# Patient Record
Sex: Female | Born: 1970 | Race: White | Hispanic: No | Marital: Married | Smoking: Never smoker
Health system: Southern US, Community
[De-identification: ages and names within clinical notes are randomized; demographics above are authoritative.]

## PROBLEM LIST (undated history)

## (undated) DIAGNOSIS — D649 Anemia, unspecified: Secondary | ICD-10-CM

## (undated) DIAGNOSIS — C801 Malignant (primary) neoplasm, unspecified: Secondary | ICD-10-CM

## (undated) HISTORY — DX: Malignant (primary) neoplasm, unspecified: C80.1

## (undated) HISTORY — DX: Anemia, unspecified: D64.9

---

## 2004-03-01 HISTORY — PX: SKIN CANCER EXCISION: SHX779

## 2005-12-17 ENCOUNTER — Ambulatory Visit: Payer: Self-pay | Admitting: Gynecology

## 2006-01-31 ENCOUNTER — Encounter (INDEPENDENT_AMBULATORY_CARE_PROVIDER_SITE_OTHER): Payer: Self-pay | Admitting: Gynecology

## 2006-01-31 ENCOUNTER — Ambulatory Visit: Payer: Self-pay | Admitting: Gynecology

## 2007-03-06 ENCOUNTER — Encounter (INDEPENDENT_AMBULATORY_CARE_PROVIDER_SITE_OTHER): Payer: Self-pay | Admitting: Gynecology

## 2007-03-06 ENCOUNTER — Ambulatory Visit: Payer: Self-pay | Admitting: Gynecology

## 2008-04-11 ENCOUNTER — Encounter: Admission: RE | Admit: 2008-04-11 | Discharge: 2008-04-11 | Payer: Self-pay | Admitting: Internal Medicine

## 2008-04-23 ENCOUNTER — Encounter: Payer: Self-pay | Admitting: Obstetrics & Gynecology

## 2008-04-23 ENCOUNTER — Ambulatory Visit: Payer: Self-pay | Admitting: Obstetrics & Gynecology

## 2008-04-24 ENCOUNTER — Ambulatory Visit: Payer: Self-pay | Admitting: Family Medicine

## 2008-04-24 LAB — CONVERTED CEMR LAB
Alkaline Phosphatase: 63 units/L (ref 39–117)
Cholesterol: 198 mg/dL (ref 0–200)
Creatinine, Ser: 0.68 mg/dL (ref 0.40–1.20)
Glucose, Bld: 101 mg/dL — ABNORMAL HIGH (ref 70–99)
HCT: 36.2 % (ref 36.0–46.0)
LDL Cholesterol: 111 mg/dL — ABNORMAL HIGH (ref 0–99)
MCHC: 32.9 g/dL (ref 30.0–36.0)
MCV: 84.2 fL (ref 78.0–100.0)
Platelets: 162 10*3/uL (ref 150–400)
Sodium: 141 meq/L (ref 135–145)
Total Bilirubin: 0.4 mg/dL (ref 0.3–1.2)
Total CHOL/HDL Ratio: 2.9
Total Protein: 7.1 g/dL (ref 6.0–8.3)
Triglycerides: 96 mg/dL (ref <150)
VLDL: 19 mg/dL (ref 0–40)

## 2009-05-27 ENCOUNTER — Ambulatory Visit: Payer: Self-pay | Admitting: Obstetrics & Gynecology

## 2009-05-27 ENCOUNTER — Encounter: Payer: Self-pay | Admitting: Obstetrics and Gynecology

## 2009-05-27 LAB — CONVERTED CEMR LAB
ALT: 10 units/L (ref 0–35)
AST: 18 units/L (ref 0–37)
Albumin: 4.1 g/dL (ref 3.5–5.2)
Alkaline Phosphatase: 63 units/L (ref 39–117)
Glucose, Bld: 91 mg/dL (ref 70–99)
LDL Cholesterol: 126 mg/dL — ABNORMAL HIGH (ref 0–99)
MCHC: 32.4 g/dL (ref 30.0–36.0)
MCV: 85.8 fL (ref 78.0–100.0)
Platelets: 178 10*3/uL (ref 150–400)
Potassium: 4.4 meq/L (ref 3.5–5.3)
RBC: 4.43 M/uL (ref 3.87–5.11)
RDW: 13.7 % (ref 11.5–15.5)
Sodium: 138 meq/L (ref 135–145)
Total Bilirubin: 0.3 mg/dL (ref 0.3–1.2)
Total Protein: 6.9 g/dL (ref 6.0–8.3)
Triglycerides: 110 mg/dL (ref <150)
VLDL: 22 mg/dL (ref 0–40)

## 2010-06-15 ENCOUNTER — Ambulatory Visit (INDEPENDENT_AMBULATORY_CARE_PROVIDER_SITE_OTHER): Payer: BLUE CROSS/BLUE SHIELD | Admitting: Obstetrics & Gynecology

## 2010-06-15 DIAGNOSIS — Z1272 Encounter for screening for malignant neoplasm of vagina: Secondary | ICD-10-CM

## 2010-06-15 DIAGNOSIS — Z01419 Encounter for gynecological examination (general) (routine) without abnormal findings: Secondary | ICD-10-CM

## 2010-06-16 NOTE — Assessment & Plan Note (Signed)
Julia Dean, GANGI NO.:  [id number]  MEDICAL RECORD NO.:  [id number]           PATIENT TYPE:  LOCATION:  [facility] at [facility]           FACILITY:  PHYSICIAN:  Allie Bossier, MD             DATE OF BIRTH:  DATE OF SERVICE:  06/15/2010                                 CLINIC NOTE  Ms. Julia Dean is a 40 year old married, white G2 P2.  She has a 33 year old son and a 65-year-old daughter.  She comes here for annual exam.  She would like to continue her Mircette, which she takes for menstrual regulation (her husband had a vasectomy).  She has no medical problems.  PAST SURGICAL HISTORY:  She had C-section.  REVIEW OF SYSTEMS:  She is a Chartered loss adjuster.  She has been married for 19 years and denies dyspareunia.  The remainder of review of systems questions are negative.  MEDICATIONS:  Mircette daily and Zyrtec on a p.r.n. basis.  FAMILY HISTORY:  Negative for breast and GYN cancers, but her paternal grandmother had colon cancer.  No known drug allergies.  No latex allergies.  SOCIAL HISTORY:  She denies tobacco or drug use and drinks alcohol on a social basis.  PHYSICAL EXAMINATION:  GENERAL:  Well-nourished, well-hydrated, pleasant white female. VITAL SIGNS:  Height 5-5, weight 162 pounds, blood pressure 128/73, pulse 79. HEENT:  Normal breast exam, normal bilaterally. HEART:  Regular rate and rhythm. LUNGS:  Clear to auscultation bilaterally. ABDOMEN:  Benign. GENITALIA:  External genitalia:  No lesions.  Cervix parous.  Normal discharge.  Uterus normal size and shape, anteverted, mobile.  Adnexa nontender.  No masses.  ASSESSMENT AND PLAN: 1. Annual exam.  I have checked Pap smear.  Recommend self-breast and     self-vulvar exams.  We will schedule her annual mammogram. 2. Menstrual regulation.  I have refilled her Mircette at her request.     Allie Bossier, MD   MCD/MEDQ  D:  06/15/2010  T:  06/16/2010  Job:  [id number]

## 2010-07-14 NOTE — Assessment & Plan Note (Signed)
Julia Dean, Julia Dean NO.:  [id number]   MEDICAL RECORD NO.:  [id number]          PATIENT TYPE:  POB   LOCATION:  [facility] at [facility]         FACILITY:  [facility]   PHYSICIAN:  Argentina Donovan, MD        DATE OF BIRTH:  04-May-1970   DATE OF SERVICE:  05/27/2009                                  CLINIC NOTE   The patient comes to office today for her yearly Pap, pelvic, breast  exam.  The patient is doing very well overall.  She is continuing to  lose weight.  She is down 11 pounds since last year.  She is losing  weight slowly.  The last year, her blood sugar was elevated fasting 101.  She does have a strong family history of diabetes, so we will recheck  that today.  She is also complaining of frequent tension headaches.  She  has been more stressed at work and she also has 2 children, ages of 71  and 51, which keep her very busy.  Her husband works out of town at  times.  She would like to remain on Mircette for regulation of her  menstrual cycle.  Her husband did have a vasectomy this summer and they  no longer need of this for birth control, but she likes the regulation  of her menstrual cycle.  She is not having any health issues this year.   ALLERGIES:  No known drug allergies.   First day of last menstrual period, May 24, 2009.   PHYSICAL EXAMINATION:  VITAL SIGNS:  Blood pressure is 119/79, pulse is  70, weight 162, height is 5 feet 5 inches.  GENERAL:  Well-developed, well-nourished 40 year old Caucasian female,  in no acute distress.  HEENT:  Head is normocephalic and atraumatic.  Pupils equal and react.  Thyroid is negative for enlargement or nodules.  CARDIAC:  Regular rate and rhythm.  LUNGS:  Clear bilaterally.  BREASTS:  Breasts are symmetrical.  There is no skin dimpling.  There is  no nipple discharge.  There are no masses appreciated.  ABDOMEN:  Soft, nontender.  No organomegaly.  PELVIC:  Externally, there are no lesions or discharges.   Internally,  there is a small amount of white nonodorous discharge.  Cervix is not  friable.  Pap smear was taken.  Bimanual exam, no cervical motion  tenderness.  No adnexal mass.  EXTREMITIES:  Warm and dry.   ASSESSMENT:  1. Yearly exam.  The patient will have her CBC, CMET, lipid profile      done today.  We will schedule her for her first screening      mammogram.  She will also have a refill on her Mircette birth      control pills 1 p.o. daily #28 with 11 refills.  2. Tension headache.  We did discuss triggers for her headache.  It      does seem to be stress related.  She will be      given a prescription for Midrin, she can take 1 p.o. p.r.n. #30      with 2 refills.  She will call the office if she has any issues or  concerns.  Otherwise, she will return in 1 year.      Remonia Richter, NP    ______________________________  Argentina Donovan, MD    LR/MEDQ  D:  05/27/2009  T:  05/27/2009  Job:  [id number]

## 2010-07-14 NOTE — Assessment & Plan Note (Signed)
NAMEJASON, HAUGE NO.:  [id number]   MEDICAL RECORD NO.:  [id number]          PATIENT TYPE:  POB   LOCATION:  [facility] at [facility]         FACILITY:  [facility]   PHYSICIAN:  Johnella Moloney, MD        DATE OF BIRTH:  [birth year]   DATE OF SERVICE:  04/23/2008                                  CLINIC NOTE   The patient comes to the office today for her yearly Pap, pelvic, and  breast exam.  The patient has been doing very well.  She gets her yearly  exam.  She has not had any blood work done recently.  She does remain on  Mircette for regulation of her menstrual cycle as well as birth control.  She and her husband are still sexually active.  She does complain of  some increased vaginal wetness from time to time.  She denies any odor  or color to her vaginal discharge.  She has been to [employer] this  past year.  She has lost 15 pounds and now she is regaining some of that  weight and has restarted [employer].  Last week, she was doing some  gymnastics and strained her left foot and she is currently in a soft  boot and expects to be out of that very soon.   Last menstrual period March 25, 2008.  Mammogram has not had as yet.  The patient denies any other problems, is doing well with her job and  her family life.   PHYSICAL EXAMINATION:  VITAL SIGNS:  Blood pressure is 129/77, pulse is  68, weight is 173, height is 5 feet 5.  GENERAL:  Well-developed, well-nourished 40 year old Caucasian female in  no acute distress.  HEENT:  Head is normocephalic and atraumatic.  Thyroid is not enlarged.  There are no nodules.  CARDIAC:  Regular rate and rhythm.  LUNGS:  Clear bilaterally.  BREASTS:  Nontender.  There is no mass appreciated.  There is no nipple  discharge.  ABDOMEN:  Obese, soft, nontender.  GENITALIA:  Externally, there are no lesions or discharges.  Internally,  the vaginal vault is pink.  There is a very scant amount of vaginal  discharge.  Cervix is  closed.  There are no lesions.  Bimanual exam,  there is no cervical motion tenderness.  There is no adnexal mass.  EXTREMITIES:  Left foot bruise and somewhat swollen.   ASSESSMENT AND PLAN:  1. Yearly exam.  We will order some routine labs to be done TSH, CBC,      CMET, and lipid panel.  The patient is aware she needs to come back      in fasting for this.  2. Contraception.  She will continue the Mircette birth control pills      1 p.o. daily #28 with p.r.n. refills.  The patient will get her      mammogram at her next visit.  She will return to the office if she      is having any problems.      Of note, the patient is asking about a referral for varicose veins.      She is referred to  Dr. Lavada Mesi.  I am sure she will return to      the clinic in 1 year.      Remonia Richter, NP    ______________________________  Johnella Moloney, MD    LR/MEDQ  D:  04/23/2008  T:  04/24/2008  Job:  [id number]

## 2010-08-12 ENCOUNTER — Other Ambulatory Visit: Payer: Self-pay | Admitting: Obstetrics & Gynecology

## 2010-08-12 DIAGNOSIS — Z1231 Encounter for screening mammogram for malignant neoplasm of breast: Secondary | ICD-10-CM

## 2010-09-03 ENCOUNTER — Ambulatory Visit
Admission: RE | Admit: 2010-09-03 | Discharge: 2010-09-03 | Disposition: A | Discharge status: Home / Self Care | Payer: BLUE CROSS/BLUE SHIELD | Source: Ambulatory Visit | Attending: Obstetrics & Gynecology | Admitting: Obstetrics & Gynecology

## 2010-09-03 DIAGNOSIS — Z1231 Encounter for screening mammogram for malignant neoplasm of breast: Secondary | ICD-10-CM

## 2011-09-01 ENCOUNTER — Encounter: Payer: Self-pay | Admitting: Gynecology

## 2011-09-01 ENCOUNTER — Ambulatory Visit (INDEPENDENT_AMBULATORY_CARE_PROVIDER_SITE_OTHER): Payer: BLUE CROSS/BLUE SHIELD | Admitting: Obstetrics & Gynecology

## 2011-09-01 VITALS — BP 104/71 | HR 68 | Ht 65.0 in | Wt 157.0 lb

## 2011-09-01 DIAGNOSIS — Z1231 Encounter for screening mammogram for malignant neoplasm of breast: Secondary | ICD-10-CM

## 2011-09-01 DIAGNOSIS — R5383 Other fatigue: Secondary | ICD-10-CM

## 2011-09-01 DIAGNOSIS — D649 Anemia, unspecified: Secondary | ICD-10-CM

## 2011-09-01 DIAGNOSIS — Z3041 Encounter for surveillance of contraceptive pills: Secondary | ICD-10-CM

## 2011-09-01 DIAGNOSIS — Z0000 Encounter for general adult medical examination without abnormal findings: Secondary | ICD-10-CM

## 2011-09-01 DIAGNOSIS — Z124 Encounter for screening for malignant neoplasm of cervix: Secondary | ICD-10-CM

## 2011-09-01 DIAGNOSIS — Z01419 Encounter for gynecological examination (general) (routine) without abnormal findings: Secondary | ICD-10-CM

## 2011-09-01 DIAGNOSIS — Z1151 Encounter for screening for human papillomavirus (HPV): Secondary | ICD-10-CM

## 2011-09-01 LAB — CBC
HCT: 37.3 % (ref 36.0–46.0)
Hemoglobin: 12.1 g/dL (ref 12.0–15.0)
MCHC: 32.4 g/dL (ref 30.0–36.0)
RBC: 4.42 MIL/uL (ref 3.87–5.11)
WBC: 5.4 10*3/uL (ref 4.0–10.5)

## 2011-09-01 MED ORDER — DESOGESTREL-ETHINYL ESTRADIOL 0.15-0.02/0.01 MG (21/5) PO TABS: 1.0000 | ORAL_TABLET | Freq: Every day | ORAL | Status: DC

## 2011-09-01 NOTE — Patient Instructions (Signed)
Preventive Care for Adults, Female A healthy lifestyle and preventive care can promote health and wellness. Preventive health guidelines for women include the following key practices.  A routine yearly physical is a good way to check with your caregiver about your health and preventive screening. It is a chance to share any concerns and updates on your health, and to receive a thorough exam.   Visit your dentist for a routine exam and preventive care every 6 months. Brush your teeth twice a day and floss once a day. Good oral hygiene prevents tooth decay and gum disease.   The frequency of eye exams is based on your age, health, family medical history, use of contact lenses, and other factors. Follow your caregiver's recommendations for frequency of eye exams.   Eat a healthy diet. Foods like vegetables, fruits, whole grains, low-fat dairy products, and lean protein foods contain the nutrients you need without too many calories. Decrease your intake of foods high in solid fats, added sugars, and salt. Eat the right amount of calories for you.Get information about a proper diet from your caregiver, if necessary.   Regular physical exercise is one of the most important things you can do for your health. Most adults should get at least 150 minutes of moderate-intensity exercise (any activity that increases your heart rate and causes you to sweat) each week. In addition, most adults need muscle-strengthening exercises on 2 or more days a week.   Maintain a healthy weight. The body mass index (BMI) is a screening tool to identify possible weight problems. It provides an estimate of body fat based on height and weight. Your caregiver can help determine your BMI, and can help you achieve or maintain a healthy weight.For adults 20 years and older:   A BMI below 18.5 is considered underweight.   A BMI of 18.5 to 24.9 is normal.   A BMI of 25 to 29.9 is considered overweight.   A BMI of 30 and above  is considered obese.   Maintain normal blood lipids and cholesterol levels by exercising and minimizing your intake of saturated fat. Eat a balanced diet with plenty of fruit and vegetables. Blood tests for lipids and cholesterol should begin at age 20 and be repeated every 5 years. If your lipid or cholesterol levels are high, you are over 50, or you are at high risk for heart disease, you may need your cholesterol levels checked more frequently.Ongoing high lipid and cholesterol levels should be treated with medicines if diet and exercise are not effective.   If you smoke, find out from your caregiver how to quit. If you do not use tobacco, do not start.   If you are pregnant, do not drink alcohol. If you are breastfeeding, be very cautious about drinking alcohol. If you are not pregnant and choose to drink alcohol, do not exceed 1 drink per day. One drink is considered to be 12 ounces (355 mL) of beer, 5 ounces (148 mL) of wine, or 1.5 ounces (44 mL) of liquor.   Avoid use of street drugs. Do not share needles with anyone. Ask for help if you need support or instructions about stopping the use of drugs.   High blood pressure causes heart disease and increases the risk of stroke. Your blood pressure should be checked at least every 1 to 2 years. Ongoing high blood pressure should be treated with medicines if weight loss and exercise are not effective.   If you are 55 to 43   years old, ask your caregiver if you should take aspirin to prevent strokes.   Diabetes screening involves taking a blood sample to check your fasting blood sugar level. This should be done once every 3 years, after age 45, if you are within normal weight and without risk factors for diabetes. Testing should be considered at a younger age or be carried out more frequently if you are overweight and have at least 1 risk factor for diabetes.   Breast cancer screening is essential preventive care for women. You should practice  "breast self-awareness." This means understanding the normal appearance and feel of your breasts and may include breast self-examination. Any changes detected, no matter how small, should be reported to a caregiver. Women in their 20s and 30s should have a clinical breast exam (CBE) by a caregiver as part of a regular health exam every 1 to 3 years. After age 40, women should have a CBE every year. Starting at age 40, women should consider having a mammography (breast X-ray test) every year. Women who have a family history of breast cancer should talk to their caregiver about genetic screening. Women at a high risk of breast cancer should talk to their caregivers about having magnetic resonance imaging (MRI) and a mammography every year.   The Pap test is a screening test for cervical cancer. A Pap test can show cell changes on the cervix that might become cervical cancer if left untreated. A Pap test is a procedure in which cells are obtained and examined from the lower end of the uterus (cervix).   Women should have a Pap test starting at age 21.   Between ages 21 and 29, Pap tests should be repeated every 2 years.   Beginning at age 30, you should have a Pap test every 3 years as long as the past 3 Pap tests have been normal.   Some women have medical problems that increase the chance of getting cervical cancer. Talk to your caregiver about these problems. It is especially important to talk to your caregiver if a new problem develops soon after your last Pap test. In these cases, your caregiver may recommend more frequent screening and Pap tests.   The above recommendations are the same for women who have or have not gotten the vaccine for human papillomavirus (HPV).   If you had a hysterectomy for a problem that was not cancer or a condition that could lead to cancer, then you no longer need Pap tests. Even if you no longer need a Pap test, a regular exam is a good idea to make sure no other  problems are starting.   If you are between ages 65 and 70, and you have had normal Pap tests going back 10 years, you no longer need Pap tests. Even if you no longer need a Pap test, a regular exam is a good idea to make sure no other problems are starting.   If you have had past treatment for cervical cancer or a condition that could lead to cancer, you need Pap tests and screening for cancer for at least 20 years after your treatment.   If Pap tests have been discontinued, risk factors (such as a new sexual partner) need to be reassessed to determine if screening should be resumed.   The HPV test is an additional test that may be used for cervical cancer screening. The HPV test looks for the virus that can cause the cell changes on the cervix.   The cells collected during the Pap test can be tested for HPV. The HPV test could be used to screen women aged 30 years and older, and should be used in women of any age who have unclear Pap test results. After the age of 30, women should have HPV testing at the same frequency as a Pap test.   Colorectal cancer can be detected and often prevented. Most routine colorectal cancer screening begins at the age of 50 and continues through age 75. However, your caregiver may recommend screening at an earlier age if you have risk factors for colon cancer. On a yearly basis, your caregiver may provide home test kits to check for hidden blood in the stool. Use of a small camera at the end of a tube, to directly examine the colon (sigmoidoscopy or colonoscopy), can detect the earliest forms of colorectal cancer. Talk to your caregiver about this at age 50, when routine screening begins. Direct examination of the colon should be repeated every 5 to 10 years through age 75, unless early forms of pre-cancerous polyps or small growths are found.   Hepatitis C blood testing is recommended for all people born from 1945 through 1965 and any individual with known risks for  hepatitis C.   Practice safe sex. Use condoms and avoid high-risk sexual practices to reduce the spread of sexually transmitted infections (STIs). STIs include gonorrhea, chlamydia, syphilis, trichomonas, herpes, HPV, and human immunodeficiency virus (HIV). Herpes, HIV, and HPV are viral illnesses that have no cure. They can result in disability, cancer, and death. Sexually active women aged 25 and younger should be checked for chlamydia. Older women with new or multiple partners should also be tested for chlamydia. Testing for other STIs is recommended if you are sexually active and at increased risk.   Osteoporosis is a disease in which the bones lose minerals and strength with aging. This can result in serious bone fractures. The risk of osteoporosis can be identified using a bone density scan. Women ages 65 and over and women at risk for fractures or osteoporosis should discuss screening with their caregivers. Ask your caregiver whether you should take a calcium supplement or vitamin D to reduce the rate of osteoporosis.   Menopause can be associated with physical symptoms and risks. Hormone replacement therapy is available to decrease symptoms and risks. You should talk to your caregiver about whether hormone replacement therapy is right for you.   Use sunscreen with sun protection factor (SPF) of 30 or more. Apply sunscreen liberally and repeatedly throughout the day. You should seek shade when your shadow is shorter than you. Protect yourself by wearing long sleeves, pants, a wide-brimmed hat, and sunglasses year round, whenever you are outdoors.   Once a month, do a whole body skin exam, using a mirror to look at the skin on your back. Notify your caregiver of new moles, moles that have irregular borders, moles that are larger than a pencil eraser, or moles that have changed in shape or color.   Stay current with required immunizations.   Influenza. You need a dose every fall (or winter). The  composition of the flu vaccine changes each year, so being vaccinated once is not enough.   Pneumococcal polysaccharide. You need 1 to 2 doses if you smoke cigarettes or if you have certain chronic medical conditions. You need 1 dose at age 65 (or older) if you have never been vaccinated.   Tetanus, diphtheria, pertussis (Tdap, Td). Get 1 dose of   Tdap vaccine if you are younger than age 65, are over 65 and have contact with an infant, are a healthcare worker, are pregnant, or simply want to be protected from whooping cough. After that, you need a Td booster dose every 10 years. Consult your caregiver if you have not had at least 3 tetanus and diphtheria-containing shots sometime in your life or have a deep or dirty wound.   HPV. You need this vaccine if you are a woman age 26 or younger. The vaccine is given in 3 doses over 6 months.   Measles, mumps, rubella (MMR). You need at least 1 dose of MMR if you were born in 1957 or later. You may also need a second dose.   Meningococcal. If you are age 19 to 21 and a first-year college student living in a residence hall, or have one of several medical conditions, you need to get vaccinated against meningococcal disease. You may also need additional booster doses.   Zoster (shingles). If you are age 60 or older, you should get this vaccine.   Varicella (chickenpox). If you have never had chickenpox or you were vaccinated but received only 1 dose, talk to your caregiver to find out if you need this vaccine.   Hepatitis A. You need this vaccine if you have a specific risk factor for hepatitis A virus infection or you simply wish to be protected from this disease. The vaccine is usually given as 2 doses, 6 to 18 months apart.   Hepatitis B. You need this vaccine if you have a specific risk factor for hepatitis B virus infection or you simply wish to be protected from this disease. The vaccine is given in 3 doses, usually over 6 months.  Preventive Services /  Frequency Ages 40 to 64  Blood pressure check.** / Every 1 to 2 years.   Lipid and cholesterol check.** / Every 5 years beginning at age 20.   Clinical breast exam.** / Every year after age 40.   Mammogram.** / Every year beginning at age 40 and continuing for as long as you are in good health. Consult with your caregiver.   Pap test.** / Every 3 years starting at age 30 through age 65 or 70 with a history of 3 consecutive normal Pap tests.   HPV screening.** / Every 3 years from ages 30 through ages 65 to 70 with a history of 3 consecutive normal Pap tests.   Fecal occult blood test (FOBT) of stool. / Every year beginning at age 50 and continuing until age 75. You may not need to do this test if you get a colonoscopy every 10 years.   Flexible sigmoidoscopy or colonoscopy.** / Every 5 years for a flexible sigmoidoscopy or every 10 years for a colonoscopy beginning at age 50 and continuing until age 75.   Hepatitis C blood test.** / For all people born from 1945 through 1965 and any individual with known risks for hepatitis C.   Skin self-exam. / Monthly.   Influenza immunization.** / Every year.   Pneumococcal polysaccharide immunization.** / 1 to 2 doses if you smoke cigarettes or if you have certain chronic medical conditions.   Tetanus, diphtheria, pertussis (Tdap, Td) immunization.** / A one-time dose of Tdap vaccine. After that, you need a Td booster dose every 10 years.   Measles, mumps, rubella (MMR) immunization. / You need at least 1 dose of MMR if you were born in 1957 or later. You may also need a   second dose.   Varicella immunization.** / Consult your caregiver.   Meningococcal immunization.** / Consult your caregiver.   Hepatitis A immunization.** / Consult your caregiver. 2 doses, 6 to 18 months apart.   Hepatitis B immunization.** / Consult your caregiver. 3 doses, usually over 6 months.  ** Family history and personal history of risk and conditions may change  your caregiver's recommendations. Document Released: 04/13/2001 Document Revised: 02/04/2011 Document Reviewed: 07/13/2010 [employer] Patient Information 2012 [employer]. 

## 2011-09-01 NOTE — Progress Notes (Signed)
  Subjective:     Julia Dean is a 41 y.o. G2P2 female here for a comprehensive gynecologic physical exam. The patient reports no gynecologic problems. Desires refill of her OCPs.  Patient reports feeling anemic; was told she was anemic during a blood draw at the [facility].  Also feels very fatigued and worn down. Wants to have lab for evaluation for this; in addition to routine health maintenance labs (she is fasting).  History   Social History  . Marital Status: Married    Spouse Name: N/A    Number of Children: N/A  . Years of Education: N/A   Occupational History  . Not on file.   Social History Main Topics  . Smoking status: Never Smoker   . Smokeless tobacco: Not on file  . Alcohol Use: No  . Drug Use: No  . Sexually Active: Yes -- Female partner(s)    Birth Control/ Protection: Pill, Other-see comments     HUSBAND HAD VASECTOMY   Other Topics Concern  . Not on file   Social History Narrative  . No narrative on file   Health Maintenance  Topic Date Due  . Pap Smear  12/06/1986  . Tetanus/tdap  12/06/1987  . Influenza Vaccine  11/30/2011   The following portions of the patient's history were reviewed and updated as appropriate: allergies, current medications, past family history, past medical history, past social history, past surgical history and problem list.  Review of Systems A comprehensive review of systems was negative.   Objective:  Blood pressure 104/71, pulse 68, height 5\' 5"  (1.651 m), weight 157 lb (71.215 kg), last menstrual period 08/11/2011. GENERAL: Well-developed, well-nourished female in no acute distress.  HEENT: Normocephalic, atraumatic. Sclerae anicteric.  NECK: Supple. Normal thyroid.  LUNGS: Clear to auscultation bilaterally.  HEART: Regular rate and rhythm. BREASTS: Symmetric with everted nipples. No masses, skin changes, nipple drainage, or lymphadenopathy. ABDOMEN: Soft, nontender, nondistended. No organomegaly. PELVIC: Normal  external female genitalia. Vagina is pink and rugated.  Normal discharge. Normal cervix contour. Pap smear obtained. Uterus is normal in size. No adnexal mass or tenderness.  EXTREMITIES: No cyanosis, clubbing, or edema, 2+ distal pulses.   Assessment:    Healthy female exam.  Fatigue, r/o anemia   Plan:   Pap done, also ordered CBC, CMET, lipid panel, TSH, ferritin level.  Will follow up results and manage accordingly. Mammogram scheduled; OCPs refilled as per patient request Routine preventative health maintenance measures emphasized

## 2011-09-02 LAB — COMPREHENSIVE METABOLIC PANEL
Albumin: 4.2 g/dL (ref 3.5–5.2)
Alkaline Phosphatase: 56 U/L (ref 39–117)
BUN: 12 mg/dL (ref 6–23)
Calcium: 9.2 mg/dL (ref 8.4–10.5)
Chloride: 106 mEq/L (ref 96–112)
Glucose, Bld: 95 mg/dL (ref 70–99)
Potassium: 4.3 mEq/L (ref 3.5–5.3)
Sodium: 137 mEq/L (ref 135–145)
Total Protein: 7.1 g/dL (ref 6.0–8.3)

## 2011-09-02 LAB — FERRITIN: Ferritin: 50 ng/mL (ref 10–291)

## 2011-09-02 LAB — LIPID PANEL
HDL: 64 mg/dL (ref >39)
LDL Cholesterol: 112 mg/dL — ABNORMAL HIGH (ref 0–99)
Triglycerides: 140 mg/dL (ref <150)

## 2011-10-18 ENCOUNTER — Ambulatory Visit (HOSPITAL_COMMUNITY): Payer: BLUE CROSS/BLUE SHIELD

## 2011-11-04 ENCOUNTER — Ambulatory Visit (HOSPITAL_COMMUNITY): Payer: BC Managed Care – PPO

## 2011-11-23 ENCOUNTER — Ambulatory Visit (HOSPITAL_COMMUNITY)
Admission: RE | Admit: 2011-11-23 | Discharge: 2011-11-23 | Disposition: A | Discharge status: Home / Self Care | Payer: BC Managed Care – PPO | Source: Ambulatory Visit | Attending: Obstetrics & Gynecology | Admitting: Obstetrics & Gynecology

## 2011-11-23 DIAGNOSIS — Z1231 Encounter for screening mammogram for malignant neoplasm of breast: Secondary | ICD-10-CM | POA: Insufficient documentation

## 2011-11-25 ENCOUNTER — Other Ambulatory Visit: Payer: Self-pay | Admitting: Obstetrics & Gynecology

## 2011-11-25 DIAGNOSIS — R928 Other abnormal and inconclusive findings on diagnostic imaging of breast: Secondary | ICD-10-CM

## 2011-11-26 ENCOUNTER — Other Ambulatory Visit: Payer: Self-pay | Admitting: Obstetrics & Gynecology

## 2011-11-26 DIAGNOSIS — R928 Other abnormal and inconclusive findings on diagnostic imaging of breast: Secondary | ICD-10-CM

## 2011-11-26 NOTE — Progress Notes (Signed)
Possible asymmetry in right breast noted on screening mammogram; diagnostic mammogram and right breast ultrasound ordered. Patient called and informed of results and recommendations.

## 2011-11-30 ENCOUNTER — Ambulatory Visit
Admission: RE | Admit: 2011-11-30 | Discharge: 2011-11-30 | Disposition: A | Discharge status: Home / Self Care | Payer: BC Managed Care – PPO | Source: Ambulatory Visit | Attending: Obstetrics & Gynecology | Admitting: Obstetrics & Gynecology

## 2011-11-30 ENCOUNTER — Other Ambulatory Visit: Payer: Self-pay | Admitting: Obstetrics & Gynecology

## 2011-11-30 DIAGNOSIS — R928 Other abnormal and inconclusive findings on diagnostic imaging of breast: Secondary | ICD-10-CM

## 2011-12-01 NOTE — Progress Notes (Signed)
Message left with husband Nida Boatman.

## 2012-08-10 ENCOUNTER — Other Ambulatory Visit: Payer: Self-pay | Admitting: Obstetrics & Gynecology

## 2012-12-11 ENCOUNTER — Other Ambulatory Visit: Payer: Self-pay

## 2012-12-11 DIAGNOSIS — Z1231 Encounter for screening mammogram for malignant neoplasm of breast: Secondary | ICD-10-CM

## 2013-01-03 ENCOUNTER — Ambulatory Visit: Payer: BC Managed Care – PPO

## 2013-01-30 ENCOUNTER — Ambulatory Visit
Admission: RE | Admit: 2013-01-30 | Discharge: 2013-01-30 | Disposition: A | Discharge status: Home / Self Care | Payer: BC Managed Care – PPO | Source: Ambulatory Visit

## 2013-01-30 DIAGNOSIS — Z1231 Encounter for screening mammogram for malignant neoplasm of breast: Secondary | ICD-10-CM

## 2013-11-28 ENCOUNTER — Other Ambulatory Visit: Payer: Self-pay

## 2013-11-28 DIAGNOSIS — Z1231 Encounter for screening mammogram for malignant neoplasm of breast: Secondary | ICD-10-CM

## 2013-12-31 ENCOUNTER — Encounter: Payer: Self-pay | Admitting: Gynecology

## 2014-02-01 ENCOUNTER — Ambulatory Visit
Admission: RE | Admit: 2014-02-01 | Discharge: 2014-02-01 | Disposition: A | Discharge status: Home / Self Care | Payer: Self-pay | Source: Ambulatory Visit

## 2014-02-01 DIAGNOSIS — Z1231 Encounter for screening mammogram for malignant neoplasm of breast: Secondary | ICD-10-CM

## 2015-01-21 ENCOUNTER — Other Ambulatory Visit: Payer: Self-pay

## 2015-01-21 DIAGNOSIS — Z1231 Encounter for screening mammogram for malignant neoplasm of breast: Secondary | ICD-10-CM

## 2015-02-25 ENCOUNTER — Ambulatory Visit
Admission: RE | Admit: 2015-02-25 | Discharge: 2015-02-25 | Disposition: A | Discharge status: Home / Self Care | Payer: BC Managed Care – PPO | Source: Ambulatory Visit

## 2015-02-25 DIAGNOSIS — Z1231 Encounter for screening mammogram for malignant neoplasm of breast: Secondary | ICD-10-CM

## 2016-04-05 ENCOUNTER — Other Ambulatory Visit: Payer: Self-pay | Admitting: Family Medicine

## 2016-04-05 DIAGNOSIS — Z1231 Encounter for screening mammogram for malignant neoplasm of breast: Secondary | ICD-10-CM

## 2016-04-29 ENCOUNTER — Ambulatory Visit
Admission: RE | Admit: 2016-04-29 | Discharge: 2016-04-29 | Disposition: A | Discharge status: Home / Self Care | Payer: BC Managed Care – PPO | Source: Ambulatory Visit | Attending: Family Medicine | Admitting: Family Medicine

## 2016-04-29 DIAGNOSIS — Z1231 Encounter for screening mammogram for malignant neoplasm of breast: Secondary | ICD-10-CM

## 2016-06-30 ENCOUNTER — Encounter: Payer: Self-pay | Admitting: Physical Therapy

## 2016-06-30 ENCOUNTER — Ambulatory Visit: Payer: BC Managed Care – PPO | Attending: Family Medicine | Admitting: Physical Therapy

## 2016-06-30 DIAGNOSIS — M25552 Pain in left hip: Secondary | ICD-10-CM | POA: Insufficient documentation

## 2016-06-30 DIAGNOSIS — M542 Cervicalgia: Secondary | ICD-10-CM | POA: Insufficient documentation

## 2016-06-30 DIAGNOSIS — R293 Abnormal posture: Secondary | ICD-10-CM | POA: Diagnosis present

## 2016-06-30 DIAGNOSIS — M62830 Muscle spasm of back: Secondary | ICD-10-CM | POA: Diagnosis present

## 2016-06-30 DIAGNOSIS — M545 Low back pain, unspecified: Secondary | ICD-10-CM

## 2016-06-30 NOTE — Patient Instructions (Signed)
Posture - Standing   Good posture is important. Avoid slouching and forward head thrust. Maintain curve in low back and align ears over shoulders, hips over ankles.  Pull your belly button in toward your back bone. Posture Tips DO: - stand tall and erect - keep chin tucked in - keep head and shoulders in alignment - check posture regularly in mirror or large window - pull head back against headrest in car seat;  Change your position often.  Sit with lumbar support. DON'T: - slouch or slump while watching TV or reading - sit, stand or lie in one position  for too long;  Sitting is especially hard on the spine so if you sit at a desk/use the computer, then stand up often! Copyright  VHI. All rights reserved.  Posture - Sitting  Sit upright, head facing forward. Try using a roll to support lower back. Keep shoulders relaxed, and avoid rounded back. Keep hips level with knees. Avoid crossing legs for long periods. Copyright  VHI. All rights reserved.  Chronic neck strain can develop because of poor posture and faulty work habits  Postural strain related to slumped sitting and forward head posture is a leading cause of headaches, neck and upper back pain  General strengthening and flexibility exercises are helpful in the treatment of neck pain.  Most importantly, you should learn to correct the posture that may be contributing to chronic pain.   Change positions frequently  Change your work or home environment to improve posture and mechanics.   Chair Sitting    Sit at edge of seat, spine straight, one leg extended. Put a hand on each thigh and bend forward from the hip, keeping spine straight. Allow hand on extended leg to reach toward toes. Support upper body with other arm. Hold __30_ seconds. Repeat _3__ times per session. Do _1-2__ sessions per day.  Copyright  VHI. All rights reserved.  Piriformis Stretch, Sitting    Sit, one ankle on opposite knee, same-side hand on crossed  knee. Push down on knee, keeping spine straight. Lean torso forward, with flat back, until tension is felt in hamstrings and gluteals of crossed-leg side. Hold __30_ seconds.  Repeat _3__ times per session. Do __1-2_ sessions per day.  Copyright  VHI. All rights reserved.  Low Back Stretch: One leg (Supine)    Lying on back, bring one knee toward chest by pulling gently behind knee. Hold __10__ seconds. Repeat with other leg. 5x  Copyright  VHI. All rights reserved.
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
## 2016-07-05 ENCOUNTER — Ambulatory Visit: Payer: BC Managed Care – PPO | Admitting: Physical Therapy

## 2016-07-05 ENCOUNTER — Encounter: Payer: Self-pay | Admitting: Physical Therapy

## 2016-07-05 DIAGNOSIS — M545 Low back pain, unspecified: Secondary | ICD-10-CM

## 2016-07-05 DIAGNOSIS — M542 Cervicalgia: Secondary | ICD-10-CM

## 2016-07-05 DIAGNOSIS — M25552 Pain in left hip: Secondary | ICD-10-CM

## 2016-07-05 DIAGNOSIS — R293 Abnormal posture: Secondary | ICD-10-CM

## 2016-07-05 DIAGNOSIS — M62830 Muscle spasm of back: Secondary | ICD-10-CM
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
## 2016-07-05 NOTE — Patient Instructions (Signed)
Balloon Breath    Place hands LIGHTLY on belly below navel. Imagine a balloon inside belly. Blow up balloon on breath IN, deflate balloon on breath OUT. Contract abdominals slightly to assist breath OUT. Time _3__ minutes.  Copyright  VHI. All rights reserved.   [facility] [address], [place], [place] [address] Phone # [phone] Fax [phone]

## 2016-07-07 ENCOUNTER — Encounter: Payer: Self-pay | Admitting: Physical Therapy

## 2016-07-07 ENCOUNTER — Ambulatory Visit: Payer: BC Managed Care – PPO | Admitting: Physical Therapy

## 2016-07-07 DIAGNOSIS — M542 Cervicalgia: Secondary | ICD-10-CM

## 2016-07-07 DIAGNOSIS — M545 Low back pain, unspecified: Secondary | ICD-10-CM

## 2016-07-07 DIAGNOSIS — M62830 Muscle spasm of back: Secondary | ICD-10-CM

## 2016-07-07 DIAGNOSIS — M25552 Pain in left hip: Secondary | ICD-10-CM

## 2016-07-07 DIAGNOSIS — R293 Abnormal posture: Secondary | ICD-10-CM
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
## 2016-07-12 ENCOUNTER — Ambulatory Visit: Payer: BC Managed Care – PPO | Admitting: Physical Therapy

## 2016-07-12 ENCOUNTER — Encounter: Payer: Self-pay | Admitting: Physical Therapy

## 2016-07-12 DIAGNOSIS — R293 Abnormal posture: Secondary | ICD-10-CM

## 2016-07-12 DIAGNOSIS — M542 Cervicalgia: Secondary | ICD-10-CM

## 2016-07-12 DIAGNOSIS — M25552 Pain in left hip: Secondary | ICD-10-CM

## 2016-07-12 DIAGNOSIS — M545 Low back pain, unspecified: Secondary | ICD-10-CM

## 2016-07-12 DIAGNOSIS — M62830 Muscle spasm of back: Secondary | ICD-10-CM
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
## 2016-07-14 ENCOUNTER — Ambulatory Visit: Payer: BC Managed Care – PPO | Admitting: Physical Therapy

## 2016-07-14 ENCOUNTER — Encounter: Payer: Self-pay | Admitting: Physical Therapy

## 2016-07-14 DIAGNOSIS — R293 Abnormal posture: Secondary | ICD-10-CM

## 2016-07-14 DIAGNOSIS — M545 Low back pain, unspecified: Secondary | ICD-10-CM

## 2016-07-14 DIAGNOSIS — M542 Cervicalgia: Secondary | ICD-10-CM

## 2016-07-14 DIAGNOSIS — M62830 Muscle spasm of back: Secondary | ICD-10-CM

## 2016-07-14 DIAGNOSIS — M25552 Pain in left hip: Secondary | ICD-10-CM
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
## 2016-07-19 ENCOUNTER — Ambulatory Visit: Payer: BC Managed Care – PPO | Admitting: Physical Therapy

## 2016-07-19 ENCOUNTER — Encounter: Payer: Self-pay | Admitting: Physical Therapy

## 2016-07-19 DIAGNOSIS — M545 Low back pain, unspecified: Secondary | ICD-10-CM

## 2016-07-19 DIAGNOSIS — M25552 Pain in left hip: Secondary | ICD-10-CM

## 2016-07-19 DIAGNOSIS — M62830 Muscle spasm of back: Secondary | ICD-10-CM

## 2016-07-19 DIAGNOSIS — R293 Abnormal posture: Secondary | ICD-10-CM

## 2016-07-19 DIAGNOSIS — M542 Cervicalgia: Secondary | ICD-10-CM
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
## 2016-07-21 ENCOUNTER — Encounter: Payer: Self-pay | Admitting: Physical Therapy

## 2016-07-21 ENCOUNTER — Ambulatory Visit: Payer: BC Managed Care – PPO | Admitting: Physical Therapy

## 2016-07-21 DIAGNOSIS — M542 Cervicalgia: Secondary | ICD-10-CM

## 2016-07-21 DIAGNOSIS — M545 Low back pain, unspecified: Secondary | ICD-10-CM

## 2016-07-21 DIAGNOSIS — R293 Abnormal posture: Secondary | ICD-10-CM

## 2016-07-21 DIAGNOSIS — M25552 Pain in left hip: Secondary | ICD-10-CM

## 2016-07-21 DIAGNOSIS — M62830 Muscle spasm of back: Secondary | ICD-10-CM
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
## 2016-07-27 ENCOUNTER — Ambulatory Visit: Payer: BC Managed Care – PPO | Admitting: Physical Therapy

## 2016-07-27 DIAGNOSIS — M545 Low back pain, unspecified: Secondary | ICD-10-CM

## 2016-07-27 DIAGNOSIS — M25552 Pain in left hip: Secondary | ICD-10-CM

## 2016-07-27 DIAGNOSIS — M62830 Muscle spasm of back: Secondary | ICD-10-CM

## 2016-07-27 DIAGNOSIS — R293 Abnormal posture: Secondary | ICD-10-CM

## 2016-07-27 DIAGNOSIS — M542 Cervicalgia: Secondary | ICD-10-CM
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
## 2016-08-10 ENCOUNTER — Ambulatory Visit: Payer: BC Managed Care – PPO | Attending: Family Medicine | Admitting: Physical Therapy

## 2016-08-10 ENCOUNTER — Encounter: Payer: Self-pay | Admitting: Physical Therapy

## 2016-08-10 DIAGNOSIS — M62830 Muscle spasm of back: Secondary | ICD-10-CM | POA: Diagnosis present

## 2016-08-10 DIAGNOSIS — R293 Abnormal posture: Secondary | ICD-10-CM | POA: Insufficient documentation

## 2016-08-10 DIAGNOSIS — M545 Low back pain, unspecified: Secondary | ICD-10-CM

## 2016-08-10 DIAGNOSIS — M25552 Pain in left hip: Secondary | ICD-10-CM | POA: Insufficient documentation

## 2016-08-10 DIAGNOSIS — M542 Cervicalgia: Secondary | ICD-10-CM | POA: Diagnosis present

## 2016-08-10 NOTE — Patient Instructions (Signed)
Trigger Point Dry Needling  . What is Trigger Point Dry Needling (DN)? o DN is a physical therapy technique used to treat muscle pain and dysfunction. Specifically, DN helps deactivate muscle trigger points (muscle knots).  o A thin filiform needle is used to penetrate the skin and stimulate the underlying trigger point. The goal is for a local twitch response (LTR) to occur and for the trigger point to relax. No medication of any kind is injected during the procedure.   . What Does Trigger Point Dry Needling Feel Like?  o The procedure feels different for each individual patient. Some patients report that they do not actually feel the needle enter the skin and overall the process is not painful. Very mild bleeding may occur. However, many patients feel a deep cramping in the muscle in which the needle was inserted. This is the local twitch response.   Marland Kitchen How Will I feel after the treatment? o Soreness is normal, and the onset of soreness may not occur for a few hours. Typically this soreness does not last longer than two days.  o Bruising is uncommon, however; ice can be used to decrease any possible bruising.  o In rare cases feeling tired or nauseous after the treatment is normal. In addition, your symptoms may get worse before they get better, this period will typically not last longer than 24 hours.   . What Can I do After My Treatment? o Increase your hydration by drinking more water for the next 24 hours. o You may place ice or heat on the areas treated that have become sore, however, do not use heat on inflamed or bruised areas. Heat often brings more relief post needling. o You can continue your regular activities, but vigorous activity is not recommended initially after the treatment for 24 hours. o DN is best combined with other physical therapy such as strengthening, stretching, and other therapies.    Do go to ER and tell them you had dry needling if you experience: Shortness of  breath Dry cough Difficulty breathing Pain in chest cavity   [facility] [address] [place], [place] [address] Phone # [phone] Fax [phone]
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
## 2016-08-17 ENCOUNTER — Ambulatory Visit: Payer: BC Managed Care – PPO | Admitting: Physical Therapy

## 2016-08-17 DIAGNOSIS — R293 Abnormal posture: Secondary | ICD-10-CM

## 2016-08-17 DIAGNOSIS — M545 Low back pain, unspecified: Secondary | ICD-10-CM

## 2016-08-17 DIAGNOSIS — M62830 Muscle spasm of back: Secondary | ICD-10-CM

## 2016-08-17 DIAGNOSIS — M25552 Pain in left hip: Secondary | ICD-10-CM

## 2016-08-17 DIAGNOSIS — M542 Cervicalgia: Secondary | ICD-10-CM
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
## 2016-08-24 ENCOUNTER — Ambulatory Visit: Payer: BC Managed Care – PPO | Admitting: Physical Therapy

## 2016-08-24 ENCOUNTER — Encounter: Payer: Self-pay | Admitting: Physical Therapy

## 2016-08-24 DIAGNOSIS — M542 Cervicalgia: Secondary | ICD-10-CM

## 2016-08-24 DIAGNOSIS — M25552 Pain in left hip: Secondary | ICD-10-CM

## 2016-08-24 DIAGNOSIS — M545 Low back pain, unspecified: Secondary | ICD-10-CM

## 2016-08-24 DIAGNOSIS — M62830 Muscle spasm of back: Secondary | ICD-10-CM

## 2016-08-24 DIAGNOSIS — R293 Abnormal posture: Secondary | ICD-10-CM

## 2016-08-24 NOTE — Patient Instructions (Signed)
   QUADRUPED ALTERNATE ARM AND LEG - BIRD DOG  While in a crawling position, brace at your abdominals and then slowly lift a leg and opposite arm upwards.   Maintain a level and stable pelvis and spine the entire time.        Transverse Abdominus Activation  Contract your lower abdominals as if you were trying to lift one leg from the table.  Initiate the movement but do no lift foot greater than 1 inch from the table.  Repeat opposite side.
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
## 2016-08-31 ENCOUNTER — Ambulatory Visit: Payer: BC Managed Care – PPO | Attending: Family Medicine | Admitting: Rehabilitative and Restorative Service Providers"

## 2016-08-31 DIAGNOSIS — M62830 Muscle spasm of back: Secondary | ICD-10-CM | POA: Diagnosis present

## 2016-08-31 DIAGNOSIS — M542 Cervicalgia: Secondary | ICD-10-CM | POA: Insufficient documentation

## 2016-08-31 DIAGNOSIS — M545 Low back pain, unspecified: Secondary | ICD-10-CM

## 2016-08-31 DIAGNOSIS — M25552 Pain in left hip: Secondary | ICD-10-CM | POA: Insufficient documentation

## 2016-08-31 DIAGNOSIS — R293 Abnormal posture: Secondary | ICD-10-CM | POA: Insufficient documentation
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
## 2016-09-07 ENCOUNTER — Ambulatory Visit: Payer: BC Managed Care – PPO | Admitting: Physical Therapy

## 2016-09-14 ENCOUNTER — Ambulatory Visit: Payer: BC Managed Care – PPO | Admitting: Physical Therapy

## 2016-09-15 ENCOUNTER — Ambulatory Visit: Payer: BC Managed Care – PPO

## 2016-09-15 DIAGNOSIS — M542 Cervicalgia: Secondary | ICD-10-CM

## 2016-09-15 DIAGNOSIS — M62830 Muscle spasm of back: Secondary | ICD-10-CM

## 2016-09-15 DIAGNOSIS — M25552 Pain in left hip: Secondary | ICD-10-CM

## 2016-09-15 DIAGNOSIS — M545 Low back pain, unspecified: Secondary | ICD-10-CM

## 2016-09-15 DIAGNOSIS — R293 Abnormal posture: Secondary | ICD-10-CM
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
## 2016-09-21 ENCOUNTER — Ambulatory Visit: Payer: BC Managed Care – PPO | Admitting: Physical Therapy

## 2016-09-21 ENCOUNTER — Encounter: Payer: Self-pay | Admitting: Physical Therapy

## 2016-09-21 DIAGNOSIS — M62830 Muscle spasm of back: Secondary | ICD-10-CM

## 2016-09-21 DIAGNOSIS — M545 Low back pain, unspecified: Secondary | ICD-10-CM

## 2016-09-21 DIAGNOSIS — M25552 Pain in left hip: Secondary | ICD-10-CM

## 2016-09-21 DIAGNOSIS — M542 Cervicalgia: Secondary | ICD-10-CM

## 2016-09-21 DIAGNOSIS — R293 Abnormal posture: Secondary | ICD-10-CM
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
## 2016-09-21 NOTE — Patient Instructions (Signed)
     Push up against the wall

## 2017-05-24 ENCOUNTER — Other Ambulatory Visit: Payer: Self-pay | Admitting: Family Medicine

## 2017-05-24 DIAGNOSIS — Z1231 Encounter for screening mammogram for malignant neoplasm of breast: Secondary | ICD-10-CM

## 2017-05-27 ENCOUNTER — Ambulatory Visit: Payer: BC Managed Care – PPO

## 2017-06-16 ENCOUNTER — Ambulatory Visit
Admission: RE | Admit: 2017-06-16 | Discharge: 2017-06-16 | Disposition: A | Discharge status: Home / Self Care | Payer: BC Managed Care – PPO | Source: Ambulatory Visit | Attending: Family Medicine | Admitting: Family Medicine

## 2017-06-16 DIAGNOSIS — Z1231 Encounter for screening mammogram for malignant neoplasm of breast: Secondary | ICD-10-CM

## 2018-07-21 ENCOUNTER — Other Ambulatory Visit: Payer: Self-pay | Admitting: Family Medicine

## 2018-07-21 DIAGNOSIS — Z1231 Encounter for screening mammogram for malignant neoplasm of breast: Secondary | ICD-10-CM

## 2018-09-07 ENCOUNTER — Ambulatory Visit
Admission: RE | Admit: 2018-09-07 | Discharge: 2018-09-07 | Disposition: A | Discharge status: Home / Self Care | Payer: BC Managed Care – PPO | Source: Ambulatory Visit | Attending: Family Medicine | Admitting: Family Medicine

## 2018-09-07 ENCOUNTER — Other Ambulatory Visit: Payer: Self-pay

## 2018-09-07 DIAGNOSIS — Z1231 Encounter for screening mammogram for malignant neoplasm of breast: Secondary | ICD-10-CM

## 2019-08-08 ENCOUNTER — Other Ambulatory Visit: Payer: Self-pay | Admitting: Family Medicine

## 2019-08-08 DIAGNOSIS — Z1231 Encounter for screening mammogram for malignant neoplasm of breast: Secondary | ICD-10-CM

## 2019-09-10 ENCOUNTER — Other Ambulatory Visit: Payer: Self-pay

## 2019-09-10 ENCOUNTER — Ambulatory Visit
Admission: RE | Admit: 2019-09-10 | Discharge: 2019-09-10 | Disposition: A | Discharge status: Home / Self Care | Payer: BC Managed Care – PPO | Source: Ambulatory Visit | Attending: Family Medicine | Admitting: Family Medicine

## 2019-09-10 DIAGNOSIS — Z1231 Encounter for screening mammogram for malignant neoplasm of breast: Secondary | ICD-10-CM

## 2020-10-23 ENCOUNTER — Other Ambulatory Visit: Payer: Self-pay | Admitting: Family Medicine

## 2020-10-23 DIAGNOSIS — Z1231 Encounter for screening mammogram for malignant neoplasm of breast: Secondary | ICD-10-CM

## 2020-11-20 ENCOUNTER — Other Ambulatory Visit: Payer: Self-pay

## 2020-11-20 ENCOUNTER — Ambulatory Visit
Admission: RE | Admit: 2020-11-20 | Discharge: 2020-11-20 | Disposition: A | Discharge status: Home / Self Care | Payer: BC Managed Care – PPO | Source: Ambulatory Visit

## 2020-11-20 DIAGNOSIS — Z1231 Encounter for screening mammogram for malignant neoplasm of breast: Secondary | ICD-10-CM

## 2021-12-16 ENCOUNTER — Other Ambulatory Visit: Payer: Self-pay | Admitting: Family Medicine

## 2021-12-16 DIAGNOSIS — Z1231 Encounter for screening mammogram for malignant neoplasm of breast: Secondary | ICD-10-CM

## 2021-12-21 ENCOUNTER — Ambulatory Visit
Admission: RE | Admit: 2021-12-21 | Discharge: 2021-12-21 | Disposition: A | Discharge status: Home / Self Care | Payer: BC Managed Care – PPO | Source: Ambulatory Visit | Attending: Family Medicine | Admitting: Family Medicine

## 2021-12-21 DIAGNOSIS — Z1231 Encounter for screening mammogram for malignant neoplasm of breast: Secondary | ICD-10-CM

## 2022-04-04 IMAGING — MG MM DIGITAL SCREENING BILAT W/ TOMO AND CAD
8 series · 9 of 24 positions shown · non-contrast
Comparison: Previous exam(s).

CLINICAL DATA: Screening.

EXAM:
DIGITAL SCREENING BILATERAL MAMMOGRAM WITH TOMOSYNTHESIS AND CAD
TECHNIQUE: Bilateral screening digital craniocaudal and mediolateral oblique
mammograms were obtained. Bilateral screening digital breast
tomosynthesis was performed. The images were evaluated with
computer-aided detection.

[R MLO synth-2D]
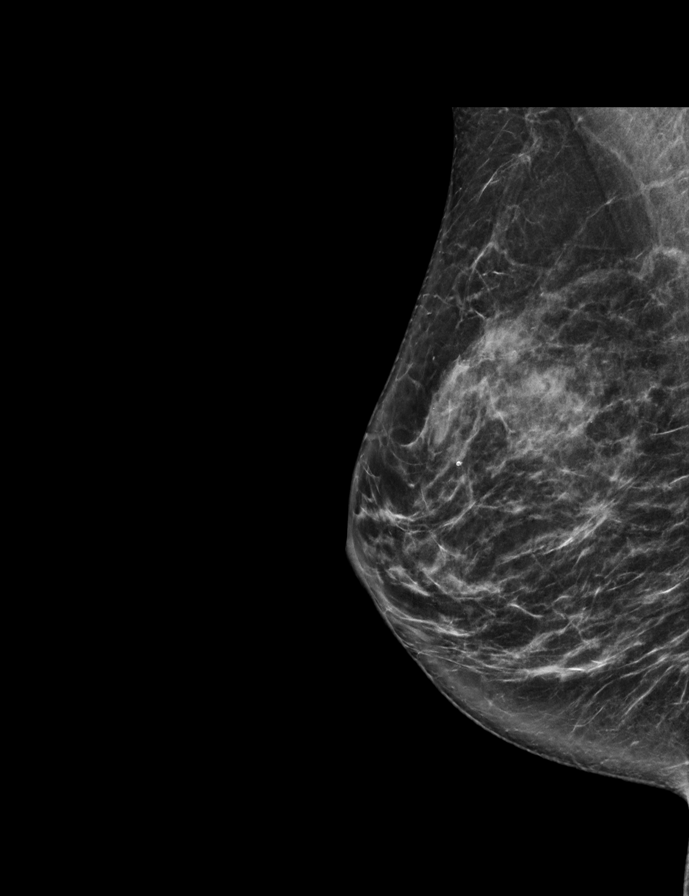

[R CC synth-2D]
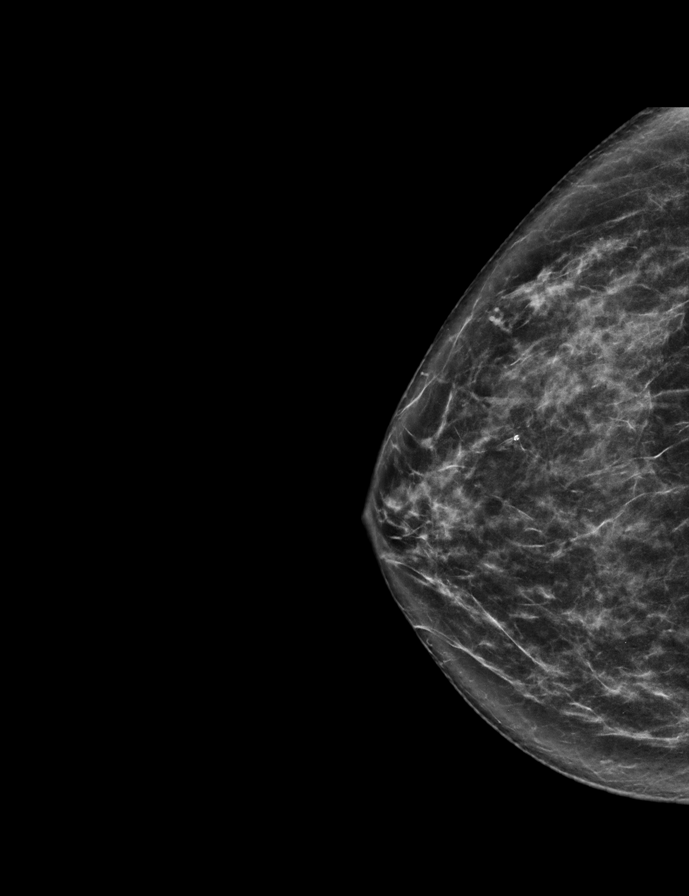

[L CC synth-2D]
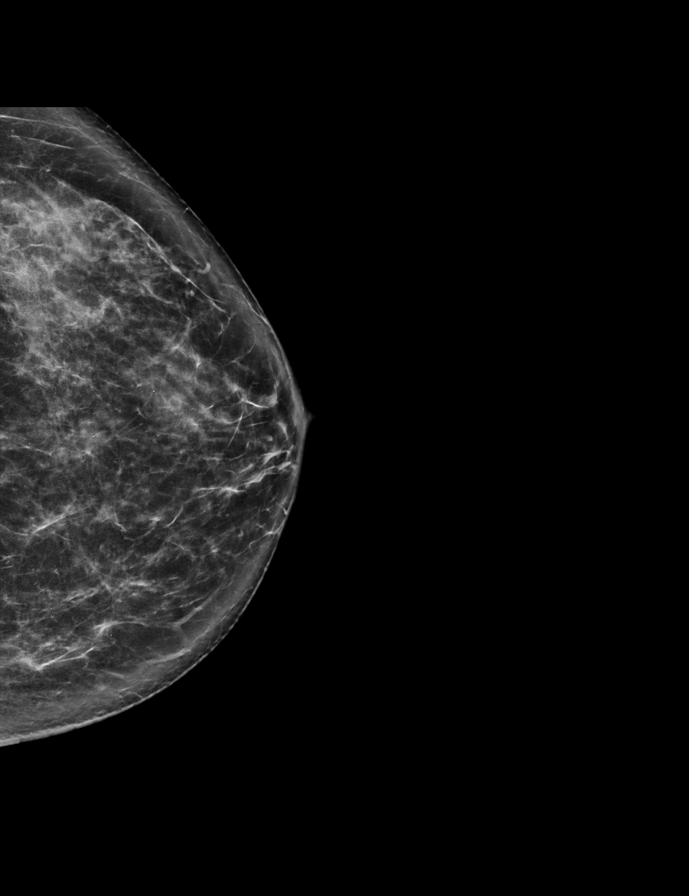

[L MLO synth-2D]
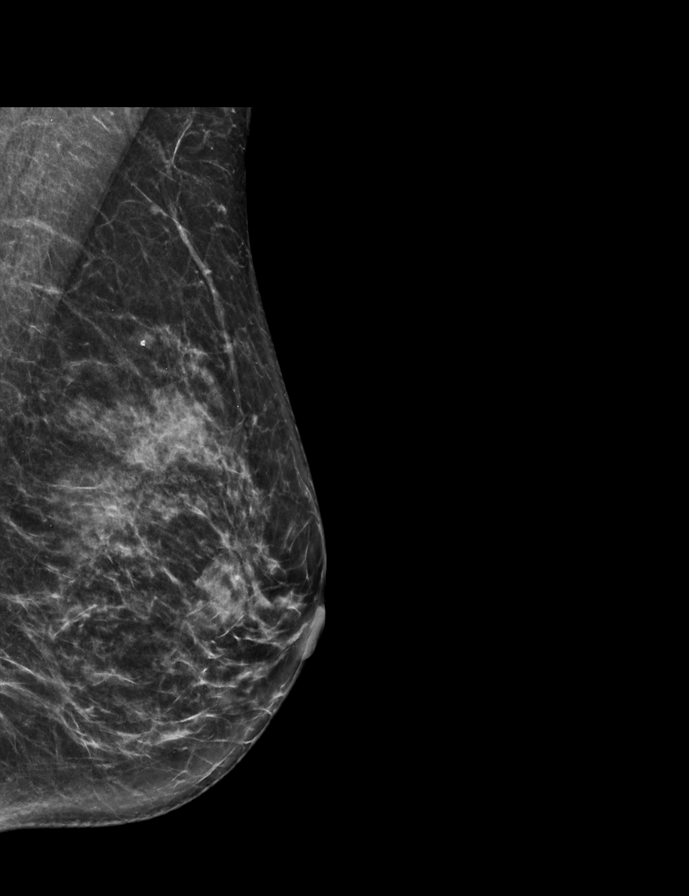

[L CC tomo · 2 of 70 frames shown]
[frame 23/70]
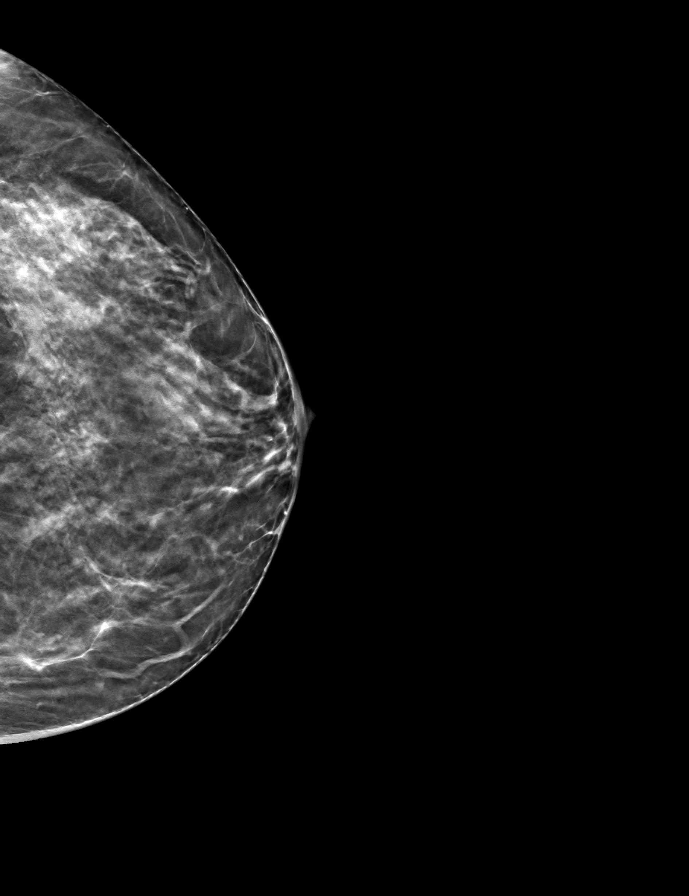
[frame 35/70]
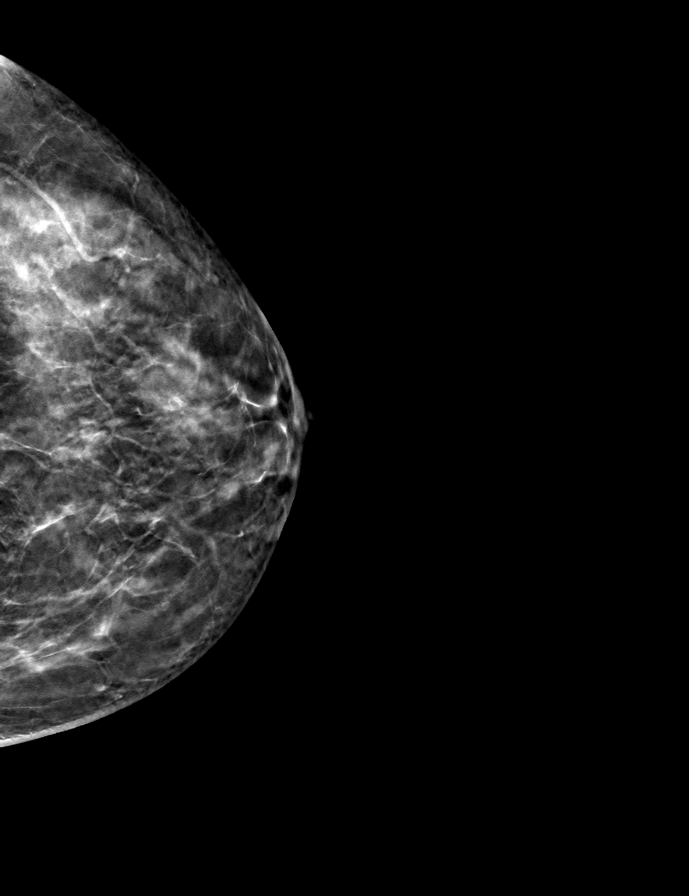

[R CC tomo · tomo slice 33/66.0]
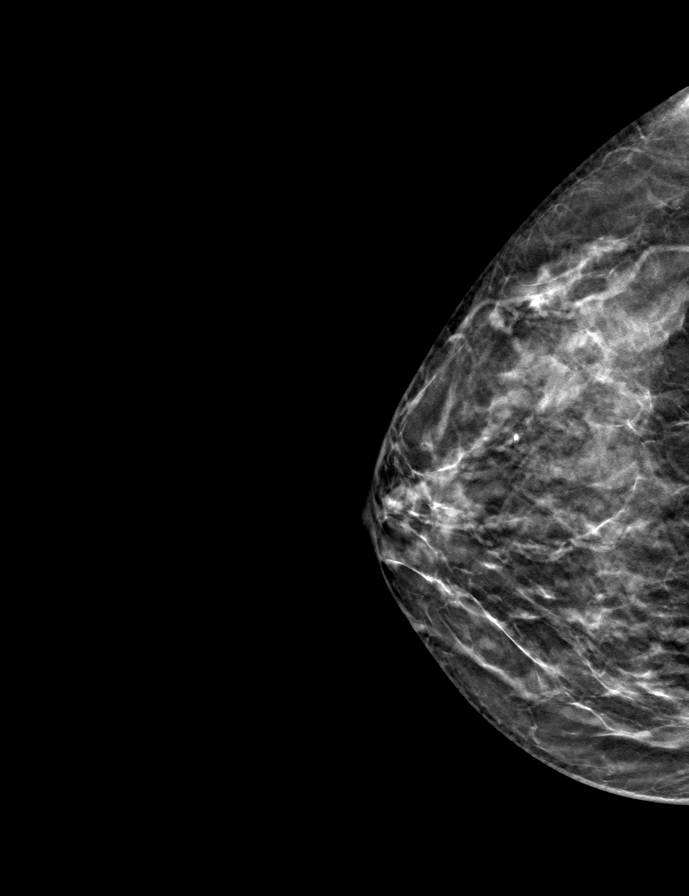

[R MLO tomo · tomo slice 35/69.0]
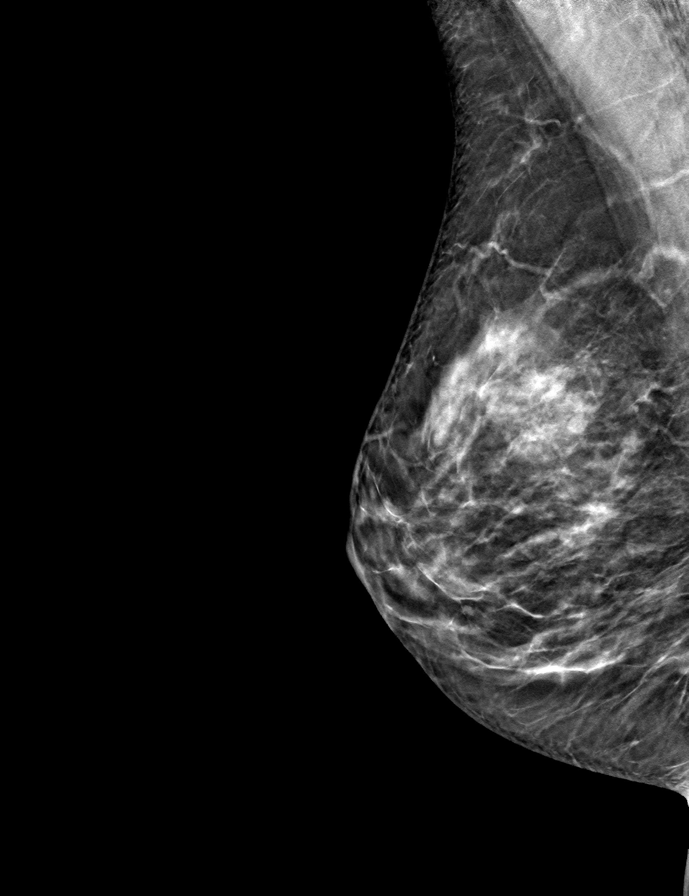

[L MLO tomo · tomo slice 35/69.0]
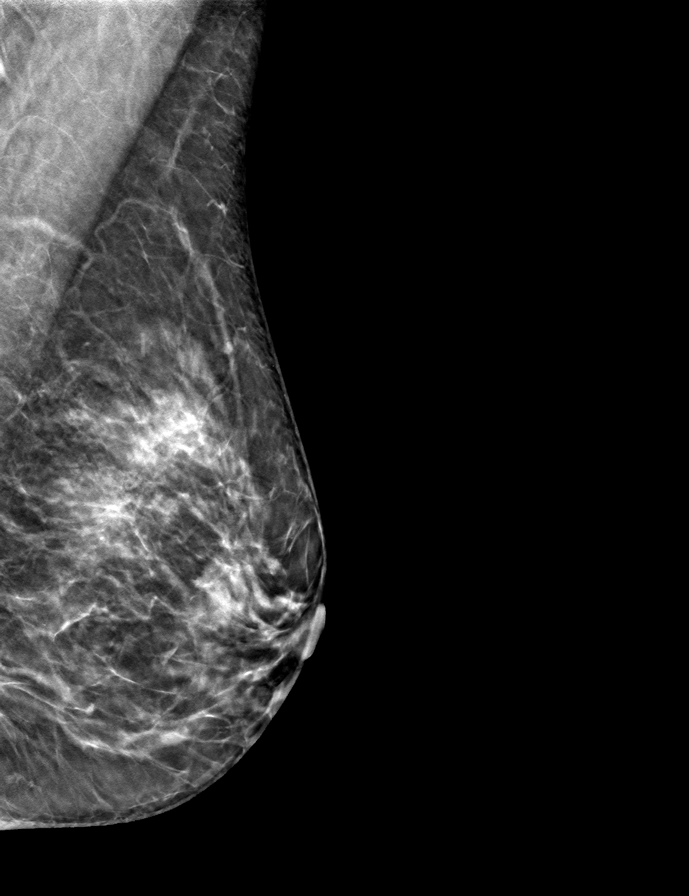

[9 of 24 positions shown; findings below may reference images not displayed]

ACR Breast Density Category c: The breast tissue is heterogeneously
dense, which may obscure small masses.
FINDINGS: There are no findings suspicious for malignancy.
IMPRESSION: No mammographic evidence of malignancy. A result letter of this
screening mammogram will be mailed directly to the patient.

RECOMMENDATION:
Screening mammogram in one year. (Code:Q3-W-BC3)

BI-RADS CATEGORY  1: Negative.

## 2022-12-09 ENCOUNTER — Other Ambulatory Visit: Payer: Self-pay | Admitting: Family Medicine

## 2022-12-09 DIAGNOSIS — Z1231 Encounter for screening mammogram for malignant neoplasm of breast: Secondary | ICD-10-CM

## 2023-01-07 ENCOUNTER — Ambulatory Visit: Payer: BC Managed Care – PPO

## 2023-02-02 ENCOUNTER — Ambulatory Visit
Admission: RE | Admit: 2023-02-02 | Discharge: 2023-02-02 | Disposition: A | Discharge status: Home / Self Care | Payer: BC Managed Care – PPO | Source: Ambulatory Visit | Attending: Family Medicine | Admitting: Family Medicine

## 2023-02-02 DIAGNOSIS — Z1231 Encounter for screening mammogram for malignant neoplasm of breast: Secondary | ICD-10-CM

## 2024-02-08 ENCOUNTER — Other Ambulatory Visit: Payer: Self-pay | Admitting: Family Medicine

## 2024-02-08 DIAGNOSIS — Z1231 Encounter for screening mammogram for malignant neoplasm of breast: Secondary | ICD-10-CM

## 2024-02-14 ENCOUNTER — Ambulatory Visit

## 2024-03-08 ENCOUNTER — Ambulatory Visit
Admission: RE | Admit: 2024-03-08 | Discharge: 2024-03-08 | Disposition: A | Discharge status: Home / Self Care | Source: Ambulatory Visit | Attending: Family Medicine | Admitting: Family Medicine

## 2024-03-08 DIAGNOSIS — Z1231 Encounter for screening mammogram for malignant neoplasm of breast: Secondary | ICD-10-CM
